# Patient Record
Sex: Female | Born: 2005 | Hispanic: No | Smoking: Never smoker
Health system: Southern US, Community
[De-identification: ages and names within clinical notes are randomized; demographics above are authoritative.]

## PROBLEM LIST (undated history)

## (undated) DIAGNOSIS — F419 Anxiety disorder, unspecified: Secondary | ICD-10-CM

## (undated) HISTORY — PX: NO PAST SURGERIES: SHX2092

## (undated) HISTORY — DX: Anxiety disorder, unspecified: F41.9

---
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
## 2023-06-23 ENCOUNTER — Ambulatory Visit: Payer: 59 | Admitting: Physician Assistant

## 2023-06-24 NOTE — Progress Notes (Unsigned)
New patient visit   Patient: Megan Wiggins   DOB: 27-Oct-2005   17 y.o. Female  MRN: [id number] Visit Date: 06/25/2023  Today's healthcare provider: Alfredia Ferguson, PA-C  Cc. New patient, cpe, concerns  Subjective    Megan Wiggins is a 17 y.o. female who presents today as a new patient to establish care.   Discussed the use of AI scribe software for clinical note transcription with the patient, who gave verbal consent to proceed.  History of Present Illness   The patient, with a history of a benign lump in the right breast, presents for a routine follow-up. The last ultrasound was performed a year ago, and the patient is due for another one. The patient reports bloody discharge some times of the month-- seems to be around when she ovulates. This is new over the las few months. The patient denies any changes in the consistency or color of the vaginal discharge otherwise. The patient has not been sexually active and denies any changes in urination or discomfort during urination.  The patient also reports hyperhidrosis, particularly affecting the underarms, hands, and feet. This condition has not been previously treated.   The patient also expresses concerns about anxiety, which has been more noticeable over the past year. The patient reports feeling stressed about various issues, including the abnormal vaginal bleeding. The patient has not sought professional help for anxiety but has discussed it with friends.       History reviewed. No pertinent past medical history. History reviewed. No pertinent surgical history. No family status information on file.   History reviewed. No pertinent family history. Social History   Socioeconomic History   Marital status: Unknown    Spouse name: Not on file   Number of children: Not on file   Years of education: Not on file   Highest education level: Not on file  Occupational History   Not on file  Tobacco Use   Smoking  status: Never   Smokeless tobacco: Never  Substance and Sexual Activity   Alcohol use: Not Currently   Drug use: Not Currently   Sexual activity: Not Currently  Other Topics Concern   Not on file  Social History Narrative   Not on file   Social Determinants of Health   Financial Resource Strain: Not on file  Food Insecurity: Not on file  Transportation Needs: Not on file  Physical Activity: Not on file  Stress: Not on file  Social Connections: Not on file   Outpatient Medications Prior to Visit  Medication Sig   [DISCONTINUED] COVID-19 At Home Antigen Test (CARESTART COVID-19 HOME TEST) KIT Use as directed   [DISCONTINUED] COVID-19 mRNA bivalent vaccine, Pfizer, (PFIZER COVID-19 VAC BIVALENT) injection Inject into the muscle.   No facility-administered medications prior to visit.   No Known Allergies  Immunization History  Administered Date(s) Administered   DTaP 04/21/2008, 06/16/2008, 08/18/2008, 09/05/2009, 10/19/2012   Fluzone Influenza virus vaccine,trivalent (IIV3), split virus 05/05/2015   HIB (PRP-T) 04/21/2008, 06/16/2008, 08/18/2008, 05/21/2009   HPV 9-valent 04/12/2021, 06/25/2023   Hepatitis A, Ped/Adol-2 Dose 04/12/2021   Hepatitis B, PED/ADOLESCENT 02/15/2008, 04/21/2008, 06/16/2008, 08/18/2008   IPV 04/21/2008, 06/16/2008, 08/18/2008, 10/19/2012   Influenza, Seasonal, Injecte, Preservative Fre 06/25/2023   Influenza,inj,Quad PF,6+ Mos 04/12/2021   Influenza,inj,Quad PF,6-35 Mos 08/18/2008, 09/22/2008   MMR 02/13/2009, 10/19/2012   Meningococcal B, OMV 04/12/2021   Meningococcal Conjugate 02/27/2016, 04/12/2021   PFIZER(Purple Top)SARS-COV-2 Vaccination 12/24/2019, 01/14/2020, 08/31/2020   PPD Test  11/23/2022   Pfizer Covid-19 Vaccine Bivalent Booster 73yrs & up 07/24/2021   Pneumococcal Conjugate-13 04/21/2008, 06/16/2008, 08/18/2008, 05/21/2009   Rotavirus Pentavalent 04/21/2008, 06/16/2008, 08/18/2008   Tdap 02/27/2016   Varicella 02/13/2009,  10/19/2012    Health Maintenance  Topic Date Due   HIV Screening  Never done   Hepatitis C Screening  Never done   COVID-19 Vaccine (5 - 2023-24 season) 04/05/2023   HPV VACCINES (3 - 3-dose series) 09/17/2023   DTaP/Tdap/Td (6 - Td or Tdap) 02/26/2026   INFLUENZA VACCINE  Completed    Patient Care Team: Alfredia Ferguson, PA-C as PCP - General (Physician Assistant)  Review of Systems  Constitutional:  Negative for fatigue and fever.  Respiratory:  Negative for cough and shortness of breath.   Cardiovascular:  Negative for chest pain and leg swelling.  Gastrointestinal:  Negative for abdominal pain.  Genitourinary:  Positive for vaginal bleeding.  Neurological:  Negative for dizziness and headaches.  Psychiatric/Behavioral:  The patient is nervous/anxious.         Objective    BP 109/70   Pulse 77   Temp 98.2 F (36.8 C) (Oral)   Ht 5' 0.5" (1.537 m)   Wt 128 lb 4 oz (58.2 kg)   LMP 06/23/2023 (Approximate)   SpO2 97%   BMI 24.63 kg/m     Physical Exam Constitutional:      General: She is awake.     Appearance: She is well-developed. She is not ill-appearing.  HENT:     Head: Normocephalic.     Right Ear: Tympanic membrane normal.     Left Ear: Tympanic membrane normal.     Nose: Nose normal. No congestion or rhinorrhea.     Mouth/Throat:     Pharynx: No oropharyngeal exudate or posterior oropharyngeal erythema.  Eyes:     Conjunctiva/sclera: Conjunctivae normal.     Pupils: Pupils are equal, round, and reactive to light.  Neck:     Thyroid: No thyroid mass or thyromegaly.  Cardiovascular:     Rate and Rhythm: Normal rate and regular rhythm.     Heart sounds: Normal heart sounds.  Pulmonary:     Effort: Pulmonary effort is normal.     Breath sounds: Normal breath sounds.  Abdominal:     Palpations: Abdomen is soft.     Tenderness: There is no abdominal tenderness.  Musculoskeletal:     Right lower leg: No swelling. No edema.     Left lower leg: No  swelling. No edema.  Lymphadenopathy:     Cervical: No cervical adenopathy.  Skin:    General: Skin is warm.  Neurological:     Mental Status: She is alert and oriented to person, place, and time.  Psychiatric:        Attention and Perception: Attention normal.        Mood and Affect: Mood normal.        Speech: Speech normal.        Behavior: Behavior normal. Behavior is cooperative.    Depression Screen    06/25/2023   10:12 AM  PHQ 2/9 Scores  PHQ - 2 Score 1  PHQ- 9 Score 6   Results for orders placed or performed in visit on 06/25/23  CBC w/Diff  Result Value Ref Range   WBC 5.6 4.5 - 13.5 K/uL   RBC 5.03 3.80 - 5.70 Mil/uL   Hemoglobin 14.1 12.0 - 16.0 g/dL   HCT 21.3 08.6 - 57.8 %   MCV  87.3 78.0 - 98.0 fl   MCHC 32.1 31.0 - 37.0 g/dL   RDW 16.1 09.6 - 04.5 %   Platelets 395.0 150.0 - 575.0 K/uL   Neutrophils Relative % 66.3 43.0 - 71.0 %   Lymphocytes Relative 25.1 24.0 - 48.0 %   Monocytes Relative 4.8 3.0 - 12.0 %   Eosinophils Relative 3.0 0.0 - 5.0 %   Basophils Relative 0.8 0.0 - 3.0 %   Neutro Abs 3.7 1.4 - 7.7 K/uL   Lymphs Abs 1.4 0.7 - 4.0 K/uL   Monocytes Absolute 0.3 0.1 - 1.0 K/uL   Eosinophils Absolute 0.2 0.0 - 0.7 K/uL   Basophils Absolute 0.0 0.0 - 0.1 K/uL  Comp Met (CMET)  Result Value Ref Range   Sodium 138 135 - 145 mEq/L   Potassium 4.1 3.5 - 5.1 mEq/L   Chloride 105 96 - 112 mEq/L   CO2 26 19 - 32 mEq/L   Glucose, Bld 88 70 - 99 mg/dL   BUN 8 6 - 23 mg/dL   Creatinine, Ser 4.09 0.40 - 1.20 mg/dL   Total Bilirubin 0.6 0.2 - 1.2 mg/dL   Alkaline Phosphatase 58 47 - 119 U/L   AST 17 0 - 37 U/L   ALT 16 0 - 35 U/L   Total Protein 7.0 6.0 - 8.3 g/dL   Albumin 4.4 3.5 - 5.2 g/dL   GFR 811.91 >47.82 mL/min   Calcium 9.4 8.4 - 10.5 mg/dL  Lipid panel  Result Value Ref Range   Cholesterol 175 0 - 200 mg/dL   Triglycerides 95.6 0.0 - 149.0 mg/dL   HDL 21.30 >86.57 mg/dL   VLDL 84.6 0.0 - 96.2 mg/dL   LDL Cholesterol 98 0 - 99 mg/dL    Total CHOL/HDL Ratio 3    NonHDL 110.39   POCT urinalysis dipstick  Result Value Ref Range   Color, UA yellow yellow   Clarity, UA cloudy (A) clear   Glucose, UA negative negative mg/dL   Bilirubin, UA small (A) negative   Ketones, POC UA negative negative mg/dL   Spec Grav, UA 9.528 4.132 - 1.025   Blood, UA moderate (A) negative   pH, UA 7.5 5.0 - 8.0   Protein Ur, POC negative negative mg/dL   Urobilinogen, UA 0.2 0.2 or 1.0 E.U./dL   Nitrite, UA Negative Negative   Leukocytes, UA Trace (A) Negative    Assessment & Plan     Annual physical exam General recommendations: --balanced diet high in fiber and protein, low in sugars, carbs, fats. --physical activity/exercise 20-30 minutes 3-5 times a week   -     CBC with Differential/Platelet -     Comprehensive metabolic panel -     Lipid panel  Hyperhidrosis Assessment & Plan: Trial of topical drysol  Orders: -     Aluminum Chloride; Apply topically at bedtime.  Dispense: 60 mL; Refill: 1  Vaginal discharge UA today w/ blood but pt on menstrual cycle.  R/o yeast/bv, pt comfortable w/ self swab. If symptoms continue, seems to be blood w/ urination, not in discharge, would repeat UA/urine micro not on cycle -     Cervicovaginal ancillary only -     POCT urinalysis dipstick  Mass of upper outer quadrant of right breast Assessment & Plan: Last [place] 06/2022, was stable, needs repeat sono to document 2 year stability. Likely FA  Orders: -     [place] LIMITED ULTRASOUND INCLUDING AXILLA RIGHT BREAST; Future  Anxiety   Discussed  benefits of therapy for coping skills and long-term management. Emphasized that therapy can provide tools for managing anxiety in various life situations.   Immunization due -     Flu vaccine trivalent PF, 6mos and older(Flulaval,Afluria,Fluarix,Fluzone) -     HPV 9-valent vaccine,Recombinat  Follow-up - Schedule follow-up visit in six months for the second HPV shot and review of any ongoing issues.        Return in about 6 months (around 12/23/2023) for hpv vaccine.      Alfredia Ferguson, PA-C  [facility] at [facility] [phone] (phone) [phone] (fax)  [facility]

## 2023-06-25 ENCOUNTER — Other Ambulatory Visit (HOSPITAL_COMMUNITY)
Admission: RE | Admit: 2023-06-25 | Discharge: 2023-06-25 | Disposition: A | Discharge status: Home / Self Care | Payer: 59 | Source: Ambulatory Visit | Attending: Physician Assistant | Admitting: Physician Assistant

## 2023-06-25 ENCOUNTER — Ambulatory Visit: Payer: 59 | Admitting: Physician Assistant

## 2023-06-25 ENCOUNTER — Other Ambulatory Visit (HOSPITAL_BASED_OUTPATIENT_CLINIC_OR_DEPARTMENT_OTHER): Payer: Self-pay

## 2023-06-25 ENCOUNTER — Encounter: Payer: Self-pay | Admitting: Physician Assistant

## 2023-06-25 VITALS — BP 109/70 | HR 77 | Temp 98.2°F | Ht 60.5 in | Wt 128.2 lb

## 2023-06-25 DIAGNOSIS — R61 Generalized hyperhidrosis: Secondary | ICD-10-CM

## 2023-06-25 DIAGNOSIS — N6311 Unspecified lump in the right breast, upper outer quadrant: Secondary | ICD-10-CM | POA: Diagnosis not present

## 2023-06-25 DIAGNOSIS — N898 Other specified noninflammatory disorders of vagina: Secondary | ICD-10-CM | POA: Diagnosis not present

## 2023-06-25 DIAGNOSIS — Z0000 Encounter for general adult medical examination without abnormal findings: Secondary | ICD-10-CM

## 2023-06-25 DIAGNOSIS — F419 Anxiety disorder, unspecified: Secondary | ICD-10-CM | POA: Insufficient documentation

## 2023-06-25 DIAGNOSIS — Z23 Encounter for immunization: Secondary | ICD-10-CM | POA: Diagnosis not present

## 2023-06-25 LAB — POCT URINALYSIS DIP (MANUAL ENTRY)
Glucose, UA: NEGATIVE mg/dL
Ketones, POC UA: NEGATIVE mg/dL
Nitrite, UA: NEGATIVE
Protein Ur, POC: NEGATIVE mg/dL
Spec Grav, UA: 1.01 (ref 1.010–1.025)
Urobilinogen, UA: 0.2 U/dL
pH, UA: 7.5 (ref 5.0–8.0)

## 2023-06-25 LAB — LIPID PANEL
Cholesterol: 175 mg/dL (ref 0–200)
HDL: 65 mg/dL (ref >39.00)
LDL Cholesterol: 98 mg/dL (ref 0–99)
NonHDL: 110.39
Total CHOL/HDL Ratio: 3
Triglycerides: 63 mg/dL (ref 0.0–149.0)
VLDL: 12.6 mg/dL (ref 0.0–40.0)

## 2023-06-25 LAB — COMPREHENSIVE METABOLIC PANEL
ALT: 16 U/L (ref 0–35)
AST: 17 U/L (ref 0–37)
Albumin: 4.4 g/dL (ref 3.5–5.2)
Alkaline Phosphatase: 58 U/L (ref 47–119)
BUN: 8 mg/dL (ref 6–23)
CO2: 26 meq/L (ref 19–32)
Calcium: 9.4 mg/dL (ref 8.4–10.5)
Chloride: 105 meq/L (ref 96–112)
Creatinine, Ser: 0.76 mg/dL (ref 0.40–1.20)
GFR: 113.61 mL/min (ref >60.00)
Glucose, Bld: 88 mg/dL (ref 70–99)
Potassium: 4.1 meq/L (ref 3.5–5.1)
Sodium: 138 meq/L (ref 135–145)
Total Bilirubin: 0.6 mg/dL (ref 0.2–1.2)
Total Protein: 7 g/dL (ref 6.0–8.3)

## 2023-06-25 LAB — CBC WITH DIFFERENTIAL/PLATELET
Basophils Absolute: 0 10*3/uL (ref 0.0–0.1)
Basophils Relative: 0.8 % (ref 0.0–3.0)
Eosinophils Absolute: 0.2 10*3/uL (ref 0.0–0.7)
Eosinophils Relative: 3 % (ref 0.0–5.0)
HCT: 43.9 % (ref 36.0–49.0)
Hemoglobin: 14.1 g/dL (ref 12.0–16.0)
Lymphocytes Relative: 25.1 % (ref 24.0–48.0)
Lymphs Abs: 1.4 10*3/uL (ref 0.7–4.0)
MCHC: 32.1 g/dL (ref 31.0–37.0)
MCV: 87.3 fL (ref 78.0–98.0)
Monocytes Absolute: 0.3 10*3/uL (ref 0.1–1.0)
Monocytes Relative: 4.8 % (ref 3.0–12.0)
Neutro Abs: 3.7 10*3/uL (ref 1.4–7.7)
Neutrophils Relative %: 66.3 % (ref 43.0–71.0)
Platelets: 395 10*3/uL (ref 150.0–575.0)
RBC: 5.03 Mil/uL (ref 3.80–5.70)
RDW: 13.4 % (ref 11.4–15.5)
WBC: 5.6 10*3/uL (ref 4.5–13.5)

## 2023-06-25 MED ORDER — ALUMINUM CHLORIDE 20 % EX SOLN
Freq: Every day | CUTANEOUS | 1 refills | Status: DC
  Filled 2023-06-25: qty 60, 30d supply, fill #0

## 2023-06-25 NOTE — Assessment & Plan Note (Signed)
Discussed benefits of therapy for coping skills and long-term management. Emphasized that therapy can provide tools for managing anxiety in various life situations.

## 2023-06-25 NOTE — Assessment & Plan Note (Signed)
Trial of topical drysol

## 2023-06-25 NOTE — Assessment & Plan Note (Signed)
Last [place] 06/2022, was stable, needs repeat sono to document 2 year stability. Likely FA

## 2023-06-26 LAB — CERVICOVAGINAL ANCILLARY ONLY
Bacterial Vaginitis (gardnerella): NEGATIVE
Candida Glabrata: NEGATIVE
Candida Vaginitis: NEGATIVE
Chlamydia: NEGATIVE
Comment: NEGATIVE
Comment: NEGATIVE
Comment: NEGATIVE
Comment: NEGATIVE
Comment: NEGATIVE
Comment: NORMAL
Neisseria Gonorrhea: NEGATIVE
Trichomonas: NEGATIVE

## 2023-07-15 ENCOUNTER — Encounter: Payer: Self-pay | Admitting: Physician Assistant

## 2023-07-15 NOTE — Addendum Note (Signed)
Addended byAlfredia Ferguson on: 07/15/2023 12:59 PM   Modules accepted: Orders

## 2023-10-24 ENCOUNTER — Encounter: Payer: Self-pay | Admitting: Physician Assistant

## 2023-12-04 ENCOUNTER — Ambulatory Visit: Admitting: Physician Assistant

## 2023-12-04 NOTE — Progress Notes (Unsigned)
      Established patient visit   Patient: Megan Wiggins   DOB: Jul 15, 2006   17 y.o. Female  MRN: [id number] Visit Date: 12/07/2023  Today's healthcare provider: Trenton Frock, PA-C   No chief complaint on file.  Subjective     ***  Medications: Outpatient Medications Prior to Visit  Medication Sig   aluminum  chloride (DRYSOL) 20 % external solution Apply topically at bedtime.   No facility-administered medications prior to visit.    Review of Systems {Insert previous labs (optional):23779} {See past labs  Heme  Chem  Endocrine  Serology  Results Review (optional):1}   Objective    There were no vitals taken for this visit. {Insert last BP/Wt (optional):23777}{See vitals history (optional):1}  Physical Exam  ***  No results found for any visits on 12/07/23.  Assessment & Plan    There are no diagnoses linked to this encounter.  ***  No follow-ups on file.       Trenton Frock, PA-C  [facility] at [facility] [phone] (phone) [phone] (fax)  [facility]

## 2023-12-07 ENCOUNTER — Ambulatory Visit (INDEPENDENT_AMBULATORY_CARE_PROVIDER_SITE_OTHER): Admitting: Physician Assistant

## 2023-12-07 ENCOUNTER — Encounter: Payer: Self-pay | Admitting: Physician Assistant

## 2023-12-07 VITALS — BP 102/66 | HR 73 | Ht 60.0 in | Wt 128.4 lb

## 2023-12-07 DIAGNOSIS — Z0289 Encounter for other administrative examinations: Secondary | ICD-10-CM

## 2023-12-11 ENCOUNTER — Other Ambulatory Visit: Payer: Self-pay | Admitting: Physician Assistant

## 2023-12-11 ENCOUNTER — Encounter: Payer: Self-pay | Admitting: Physician Assistant

## 2023-12-11 ENCOUNTER — Ambulatory Visit: Admitting: Physician Assistant

## 2023-12-11 DIAGNOSIS — Z111 Encounter for screening for respiratory tuberculosis: Secondary | ICD-10-CM

## 2023-12-14 ENCOUNTER — Other Ambulatory Visit (INDEPENDENT_AMBULATORY_CARE_PROVIDER_SITE_OTHER)

## 2023-12-14 DIAGNOSIS — Z111 Encounter for screening for respiratory tuberculosis: Secondary | ICD-10-CM | POA: Diagnosis not present

## 2023-12-16 ENCOUNTER — Ambulatory Visit: Payer: Self-pay | Admitting: Physician Assistant

## 2023-12-16 LAB — QUANTIFERON-TB GOLD PLUS
Mitogen-NIL: 5.86 [IU]/mL
NIL: 0.01 [IU]/mL
QuantiFERON-TB Gold Plus: NEGATIVE
TB1-NIL: 0.01 [IU]/mL
TB2-NIL: 0.01 [IU]/mL

## 2024-02-11 ENCOUNTER — Encounter: Payer: Self-pay | Admitting: Physician Assistant

## 2024-02-19 ENCOUNTER — Ambulatory Visit: Admitting: Physician Assistant

## 2024-02-29 ENCOUNTER — Other Ambulatory Visit (HOSPITAL_BASED_OUTPATIENT_CLINIC_OR_DEPARTMENT_OTHER): Payer: Self-pay

## 2024-02-29 ENCOUNTER — Ambulatory Visit (INDEPENDENT_AMBULATORY_CARE_PROVIDER_SITE_OTHER): Admitting: Physician Assistant

## 2024-02-29 ENCOUNTER — Encounter: Payer: Self-pay | Admitting: Physician Assistant

## 2024-02-29 ENCOUNTER — Other Ambulatory Visit: Payer: Self-pay

## 2024-02-29 VITALS — BP 101/63 | HR 85 | Ht 60.0 in | Wt 130.0 lb

## 2024-02-29 DIAGNOSIS — F419 Anxiety disorder, unspecified: Secondary | ICD-10-CM | POA: Diagnosis not present

## 2024-02-29 MED ORDER — FLUOXETINE HCL 10 MG PO CAPS
10.0000 mg | ORAL_CAPSULE | Freq: Every day | ORAL | 1 refills | Status: DC
  Filled 2024-02-29: qty 30, 30d supply, fill #0

## 2024-02-29 NOTE — Progress Notes (Signed)
 Established patient visit   Patient: Megan Wiggins   DOB: 2006/05/20   18 y.o. Female  MRN: [id number] Visit Date: 02/29/2024  Today's healthcare provider: Manuelita Flatness, PA-C   Cc. anxiety  Subjective    Discussed the use of AI scribe software for clinical note transcription with the patient, who gave verbal consent to proceed.  History of Present Illness   Megan Wiggins is a 18 year old female who presents with anxiety and obsessive-compulsive symptoms.  She believes her anxiety began during the COVID pandemic, but has noticed worsening symptoms. She experiences acute anxiety during exams, but has noticed in other part of her life. She experiences intrusive, uncontrollable worries.  She identifies with some OCD symptoms, including checking behaviors. She checks her car multiple times daily due to theft fears, despite knowing it is secure. She also had an incident where she felt unable to wear a borrowed dress due to cleanliness concerns, even after washing it.  She has not previously sought therapy or used medication for anxiety or OCD. Her boyfriend provides primary support, and she has discussed her symptoms with friends who have similar experiences.       Medications: No outpatient medications prior to visit.   No facility-administered medications prior to visit.    Review of Systems  Constitutional:  Negative for fatigue and fever.  Respiratory:  Negative for cough and shortness of breath.   Cardiovascular:  Negative for chest pain and leg swelling.  Gastrointestinal:  Negative for abdominal pain.  Neurological:  Negative for dizziness and headaches.  Psychiatric/Behavioral:  The patient is nervous/anxious.        Objective    BP 101/63   Pulse 85   Ht 5' (1.524 m)   Wt 130 lb (59 kg)   LMP 01/29/2024   BMI 25.39 kg/m    Physical Exam Constitutional:      General: She is awake.     Appearance: She is well-developed.  HENT:      Head: Normocephalic.  Eyes:     Conjunctiva/sclera: Conjunctivae normal.  Cardiovascular:     Rate and Rhythm: Normal rate and regular rhythm.     Heart sounds: Normal heart sounds.  Pulmonary:     Effort: Pulmonary effort is normal.     Breath sounds: Normal breath sounds.  Skin:    General: Skin is warm.  Neurological:     Mental Status: She is alert and oriented to person, place, and time.  Psychiatric:        Attention and Perception: Attention normal.        Mood and Affect: Mood normal.        Speech: Speech normal.        Behavior: Behavior is cooperative.     No results found for any visits on 02/29/24.  Assessment & Plan    Anxiety Assessment & Plan: W/ ocd characteristics. Needs full eval.  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  Dispense: 90 capsule; Refill: 1 -     Ambulatory referral to Psychology   Return in about 4 weeks (around 03/28/2024), or if symptoms worsen or fail to improve, for anxiety.       Manuelita Flatness, PA-C  [facility] at [facility]  [phone] (phone) [phone] (fax)  [facility]

## 2024-02-29 NOTE — Assessment & Plan Note (Signed)
 W/ ocd characteristics. Needs full eval.  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

## 2024-02-29 NOTE — Patient Instructions (Addendum)
 [email or web address] [phone]

## 2024-03-29 ENCOUNTER — Ambulatory Visit (INDEPENDENT_AMBULATORY_CARE_PROVIDER_SITE_OTHER): Admitting: Physician Assistant

## 2024-03-29 ENCOUNTER — Encounter: Payer: Self-pay | Admitting: Physician Assistant

## 2024-03-29 ENCOUNTER — Other Ambulatory Visit (HOSPITAL_BASED_OUTPATIENT_CLINIC_OR_DEPARTMENT_OTHER): Payer: Self-pay

## 2024-03-29 VITALS — BP 103/68 | HR 78 | Ht 60.0 in | Wt 126.6 lb

## 2024-03-29 DIAGNOSIS — F419 Anxiety disorder, unspecified: Secondary | ICD-10-CM | POA: Diagnosis not present

## 2024-03-29 MED ORDER — FLUOXETINE HCL 20 MG PO CAPS
20.0000 mg | ORAL_CAPSULE | Freq: Every day | ORAL | 3 refills | Status: DC
  Filled 2024-03-29: qty 30, 30d supply, fill #0
  Filled 2024-04-28: qty 30, 30d supply, fill #1
  Filled 2024-06-06: qty 30, 30d supply, fill #2
  Filled 2024-07-13: qty 30, 30d supply, fill #3

## 2024-03-29 NOTE — Progress Notes (Signed)
 Established patient visit   Patient: Megan Wiggins   DOB: 10-15-2005   18 y.o. Female  MRN: [id number] Visit Date: 03/29/2024  Today's healthcare provider: Manuelita Flatness, PA-C   Cc. Anxiety f/u  Subjective     Anxiety, Follow-up  Pt denies any improvement with Prozac  10 mg. Tolerating well with no side effects. She has not started with the [facility] psych clinic as it is cash based and her mother is not supportive of funding mental health treatment.   GAD-7 Results    03/29/2024    8:22 AM 02/29/2024    3:19 PM 06/25/2023   10:12 AM  GAD-7 Generalized Anxiety Disorder Screening Tool  1. Feeling Nervous, Anxious, or on Edge 2 2 2   2. Not Being Able to Stop or Control Worrying 1 2 2   3. Worrying Too Much About Different Things 1 3 2   4. Trouble Relaxing 3 1 2   5. Being So Restless it's Hard To Sit Still 2 2 2   6. Becoming Easily Annoyed or Irritable 1 1 1   7. Feeling Afraid As If Something Awful Might Happen 2 2 1   Total GAD-7 Score 12 13 12   Difficulty At Work, Home, or Getting  Along With Others? Very difficult Very difficult     PHQ-9 Scores    03/29/2024    8:21 AM 02/29/2024    3:18 PM 06/25/2023   10:12 AM  PHQ9 SCORE ONLY  PHQ-9 Total Score 7 4  6       Data saved with a previous flowsheet row definition    ---------------------------------------------------------------------------------------------------  Medications: Outpatient Medications Prior to Visit  Medication Sig   [DISCONTINUED] FLUoxetine  (PROZAC ) 10 MG capsule Take 1 capsule (10 mg total) by mouth daily.   No facility-administered medications prior to visit.    Review of Systems  Constitutional:  Negative for fatigue and fever.  Respiratory:  Negative for cough and shortness of breath.   Cardiovascular:  Negative for chest pain and leg swelling.  Gastrointestinal:  Negative for abdominal pain.  Neurological:  Negative for dizziness and headaches.  Psychiatric/Behavioral:  The  patient is nervous/anxious.        Objective    BP 103/68   Pulse 78   Ht 5' (1.524 m)   Wt 126 lb 9.6 oz (57.4 kg)   LMP 03/02/2024   BMI 24.72 kg/m    Physical Exam Vitals reviewed.  Constitutional:      Appearance: She is not ill-appearing.  HENT:     Head: Normocephalic.  Eyes:     Conjunctiva/sclera: Conjunctivae normal.  Cardiovascular:     Rate and Rhythm: Normal rate.  Pulmonary:     Effort: Pulmonary effort is normal. No respiratory distress.  Neurological:     Mental Status: She is alert and oriented to person, place, and time.  Psychiatric:        Mood and Affect: Mood normal.        Behavior: Behavior normal.     No results found for any visits on 03/29/24.  Assessment & Plan    Anxiety Assessment & Plan: Pt has anxiety w/ ocd features No improvement with prozac  10 mg  Increase to 20 mg  If again no improvement consider switching SSRIs  Referred to our psych clinic, pt reports if mental health treatment is through insurance it shouldn't be an issue w/ family F/b 4 weeks w/ a new provider  Orders: -     Ambulatory referral to Psychology -  FLUoxetine  HCl; Take 1 capsule (20 mg total) by mouth daily.  Dispense: 30 capsule; Refill: 3    Return in about 4 weeks (around 04/26/2024) for anxiety.       Manuelita Flatness, PA-C  [facility] at [facility] [phone] (phone) [phone] (fax)  [facility]

## 2024-03-29 NOTE — Assessment & Plan Note (Addendum)
 Pt has anxiety w/ ocd features No improvement with prozac  10 mg  Increase to 20 mg  If again no improvement consider switching SSRIs  Referred to our psych clinic, pt reports if mental health treatment is through insurance it shouldn't be an issue w/ family F/b 4 weeks w/ a new provider

## 2024-05-17 ENCOUNTER — Encounter: Admitting: Family Medicine

## 2024-06-07 ENCOUNTER — Other Ambulatory Visit (HOSPITAL_BASED_OUTPATIENT_CLINIC_OR_DEPARTMENT_OTHER): Payer: Self-pay

## 2024-08-09 ENCOUNTER — Other Ambulatory Visit (HOSPITAL_BASED_OUTPATIENT_CLINIC_OR_DEPARTMENT_OTHER): Payer: Self-pay

## 2024-08-09 ENCOUNTER — Encounter: Payer: Self-pay | Admitting: Family Medicine

## 2024-08-09 ENCOUNTER — Ambulatory Visit (INDEPENDENT_AMBULATORY_CARE_PROVIDER_SITE_OTHER): Admitting: Family Medicine

## 2024-08-09 VITALS — BP 109/72 | HR 76 | Ht 60.0 in | Wt 127.0 lb

## 2024-08-09 DIAGNOSIS — F419 Anxiety disorder, unspecified: Secondary | ICD-10-CM

## 2024-08-09 DIAGNOSIS — Z0000 Encounter for general adult medical examination without abnormal findings: Secondary | ICD-10-CM | POA: Diagnosis not present

## 2024-08-09 MED ORDER — FLUOXETINE HCL 20 MG PO CAPS
20.0000 mg | ORAL_CAPSULE | Freq: Every day | ORAL | 3 refills | Status: AC
  Filled 2024-08-09: qty 30, 30d supply, fill #0

## 2024-08-09 NOTE — Assessment & Plan Note (Signed)
 PHQ9/GAD7 reviewed. No SI/HI. Doing well on Prozac  20 mg without side effects. No desired changes at this time.  - Continue Prozac  20 mg daily and lifestyle measures.

## 2024-08-09 NOTE — Progress Notes (Signed)
 "   New Patient Office Visit   Subjective     Patient ID: Megan Wiggins, female   DOB: 2005/10/19  Age: 19 y.o. MRN: [id number]   CC:  Chief Complaint  Patient presents with   Establish Care      HPI Megan Wiggins presents to establish/transfer care. She lives with her parents. 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    Discussed the use of AI scribe software for clinical note transcription with the patient, who gave verbal consent to proceed.  History of Present Illness Megan Wiggins is a 19 year old female with anxiety who presents for primary care follow-up and medication refill.  She has a history of anxiety and is currently taking fluoxetine  20 mg daily. Her dosage was increased by 10 mg recently, around August 2025.  She lives with her parents and is currently a holiday representative in nursing school. She does not consume alcohol, nicotine, or drugs and is not sexually active. Her menstrual periods are regular.  Her family history includes diabetes in her mother and hypertension in her father, with possible hypertension in her grandparents.     Anxiety: - Treatment: Fluoxetine  20 mg daily.  - Medication side effects: none - SI/HI: no - Update: Doing well. Needs refills.      08/09/2024   10:16 AM 03/29/2024    8:21 AM 02/29/2024    3:18 PM  PHQ9 SCORE ONLY  PHQ-9 Total Score 3 7 4       Data saved with a previous flowsheet row definition      08/09/2024   10:17 AM 03/29/2024    8:22 AM 02/29/2024    3:19 PM 06/25/2023   10:12 AM  GAD 7 : Generalized Anxiety Score  Nervous, Anxious, on Edge 1 2 2 2   Control/stop worrying 0 1 2 2   Worry too much - different things 0 1 3 2   Trouble relaxing 0 3 1 2   Restless 0 2 2 2   Easily annoyed or irritable 1 1 1 1   Afraid - awful might happen 1 2 2 1   Total GAD 7 Score 3 12 13 12   Anxiety Difficulty Somewhat difficult Very difficult Very difficult        [Show/hide medication  list]  [Show/hide medication list] Outpatient Medications Prior to Visit  Medication Sig   FLUoxetine  (PROZAC ) 20 MG capsule Take 1 capsule (20 mg total) by mouth daily.   No facility-administered medications prior to visit.   Past Medical History:  Diagnosis Date   Anxiety     Past Surgical History:  Procedure Laterality Date   NO PAST SURGERIES       History reviewed. No pertinent family history.  Social History   Socioeconomic History   Marital status: Unknown    Spouse name: Not on file   Number of children: Not on file   Years of education: Not on file   Highest education level: Not on file  Occupational History   Not on file  Tobacco Use   Smoking status: Never   Smokeless tobacco: Never  Substance and Sexual Activity   Alcohol use: Not Currently   Drug use: Not Currently   Sexual activity: Not Currently  Other Topics Concern   Not on file  Social History Narrative   Not on file   Social Drivers of Health   Tobacco Use: Low Risk (08/09/2024)   Patient History    Smoking Tobacco Use: Never  Smokeless Tobacco Use: Never    Passive Exposure: Not on file  Financial Resource Strain: Not on file  Food Insecurity: Not on file  Transportation Needs: Not on file  Physical Activity: Not on file  Stress: Not on file  Social Connections: Not on file  Depression (PHQ2-9): Medium Risk (03/29/2024)   Depression (PHQ2-9)    PHQ-2 Score: 7  Alcohol Screen: Not on file  Housing: Not on file  Utilities: Not on file  Health Literacy: Not on file       ROS All review of systems negative except what is listed in the HPI    Objective     BP 109/72   Pulse 76   Ht 5' (1.524 m)   Wt 127 lb (57.6 kg)   SpO2 98%   BMI 24.80 kg/m   Physical Exam Vitals reviewed.  Constitutional:      Appearance: Normal appearance.  Cardiovascular:     Rate and Rhythm: Normal rate and regular rhythm.     Heart sounds: Normal heart sounds.  Pulmonary:     Effort: Pulmonary  effort is normal.     Breath sounds: Normal breath sounds.  Skin:    General: Skin is warm and dry.  Neurological:     Mental Status: She is alert and oriented to person, place, and time.  Psychiatric:        Mood and Affect: Mood normal.        Behavior: Behavior normal.        Thought Content: Thought content normal.        Judgment: Judgment normal.        Assessment & Plan:     Problem List Items Addressed This Visit       Active Problems   Anxiety - Primary   PHQ9/GAD7 reviewed. No SI/HI. Doing well on Prozac  20 mg without side effects. No desired changes at this time.  - Continue Prozac  20 mg daily and lifestyle measures.       Relevant Medications   FLUoxetine  (PROZAC ) 20 MG capsule   Other Visit Diagnoses       Encounter for medical examination to establish care                Megan KATHEE Mon, NP  I,Emily Lagle,acting as a scribe for Megan KATHEE Mon, NP.,have documented all relevant documentation on the behalf of Megan KATHEE Mon, NP.  I, Megan KATHEE Mon, NP, have reviewed all documentation for this visit. The documentation on 08/09/2024 for the exam, diagnosis, procedures, and orders are all accurate and complete.  "
